# Patient Record
Sex: Male | Born: 1965 | Race: Black or African American | Hispanic: No
Health system: Southern US, Community
[De-identification: ages and names within clinical notes are randomized; demographics above are authoritative.]

---

## 2013-06-30 ENCOUNTER — Ambulatory Visit (INDEPENDENT_AMBULATORY_CARE_PROVIDER_SITE_OTHER): Payer: Self-pay

## 2013-06-30 ENCOUNTER — Other Ambulatory Visit: Payer: Self-pay | Admitting: Emergency Medicine

## 2013-06-30 DIAGNOSIS — T1490XA Injury, unspecified, initial encounter: Secondary | ICD-10-CM

## 2013-06-30 DIAGNOSIS — M25579 Pain in unspecified ankle and joints of unspecified foot: Secondary | ICD-10-CM

## 2013-06-30 DIAGNOSIS — M2040 Other hammer toe(s) (acquired), unspecified foot: Secondary | ICD-10-CM

## 2013-06-30 DIAGNOSIS — M25569 Pain in unspecified knee: Secondary | ICD-10-CM

## 2015-01-18 IMAGING — CR DG ANKLE COMPLETE 3+V*R*
3 series · 3 of 3 positions shown · non-contrast
Comparison: None.

CLINICAL DATA: Foot and ankle pain after trauma 6 days ago.

EXAM:
RIGHT ANKLE - COMPLETE 3+ VIEW; RIGHT FOOT COMPLETE - 3+ VIEW

[view not recorded (1 of 3)]
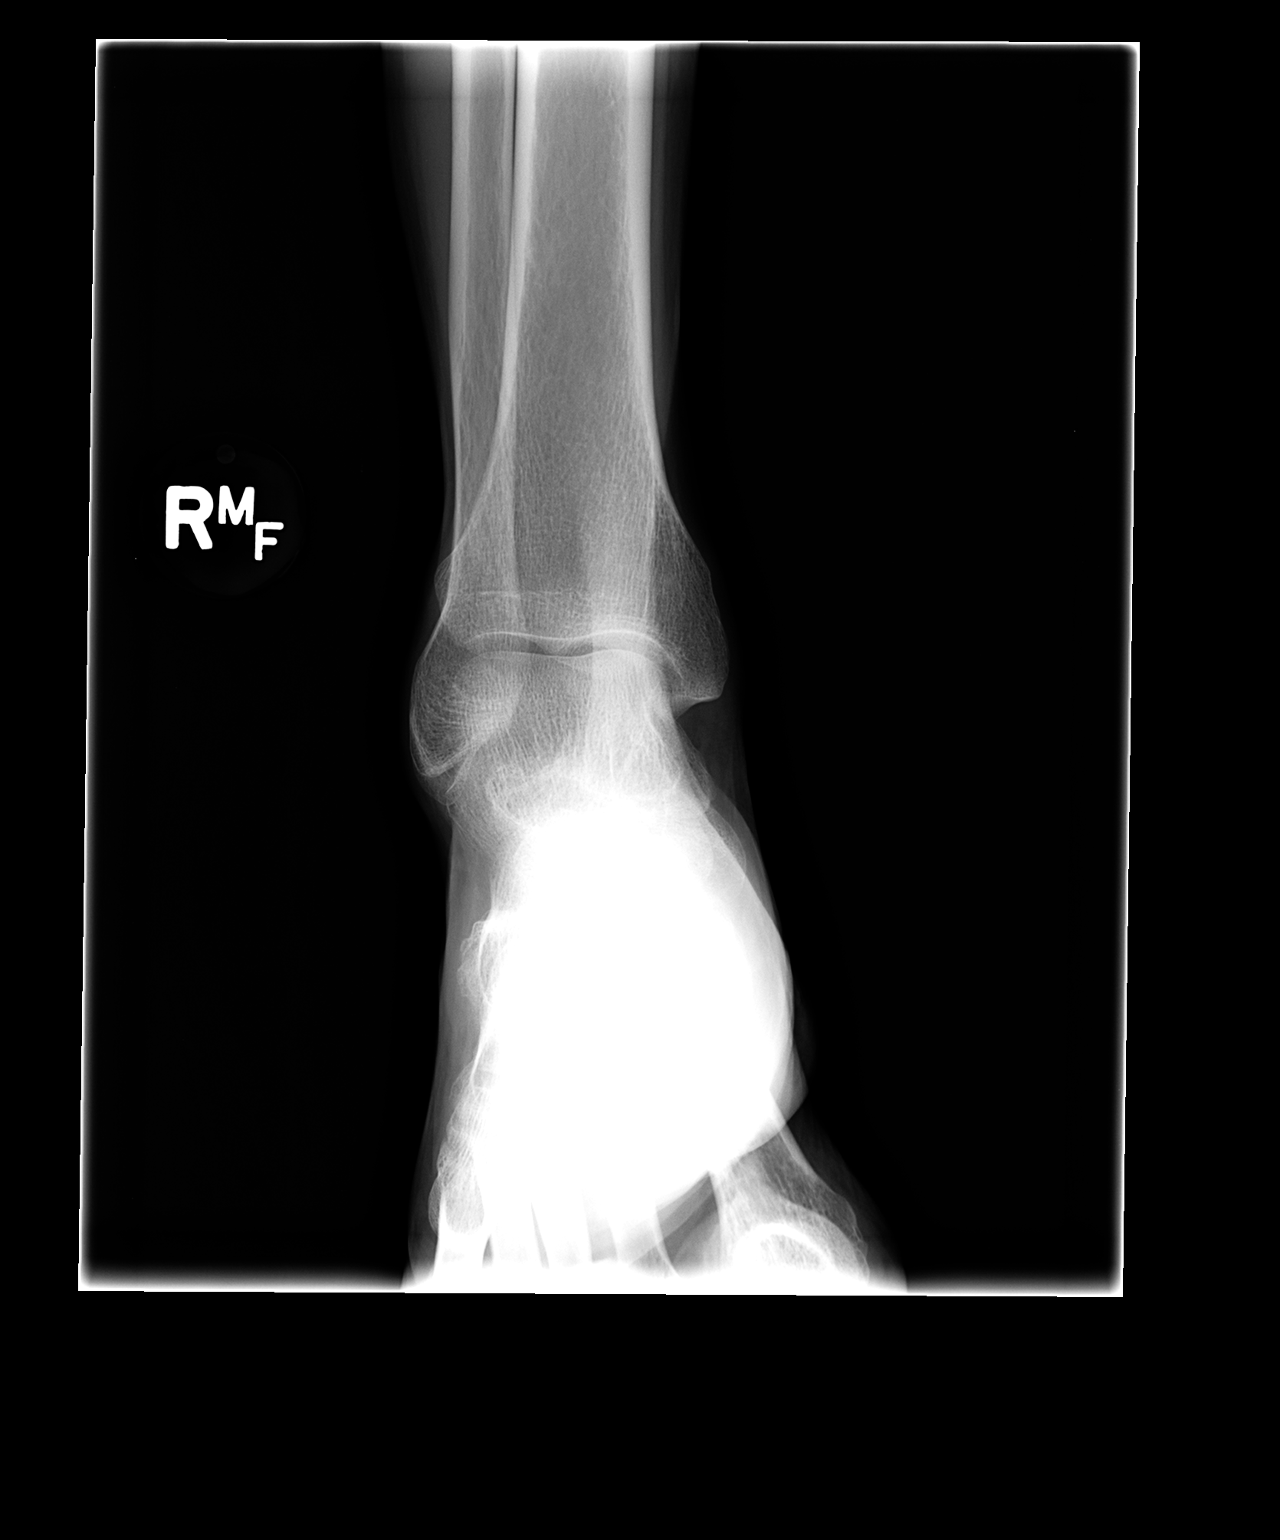

[view not recorded (2 of 3)]
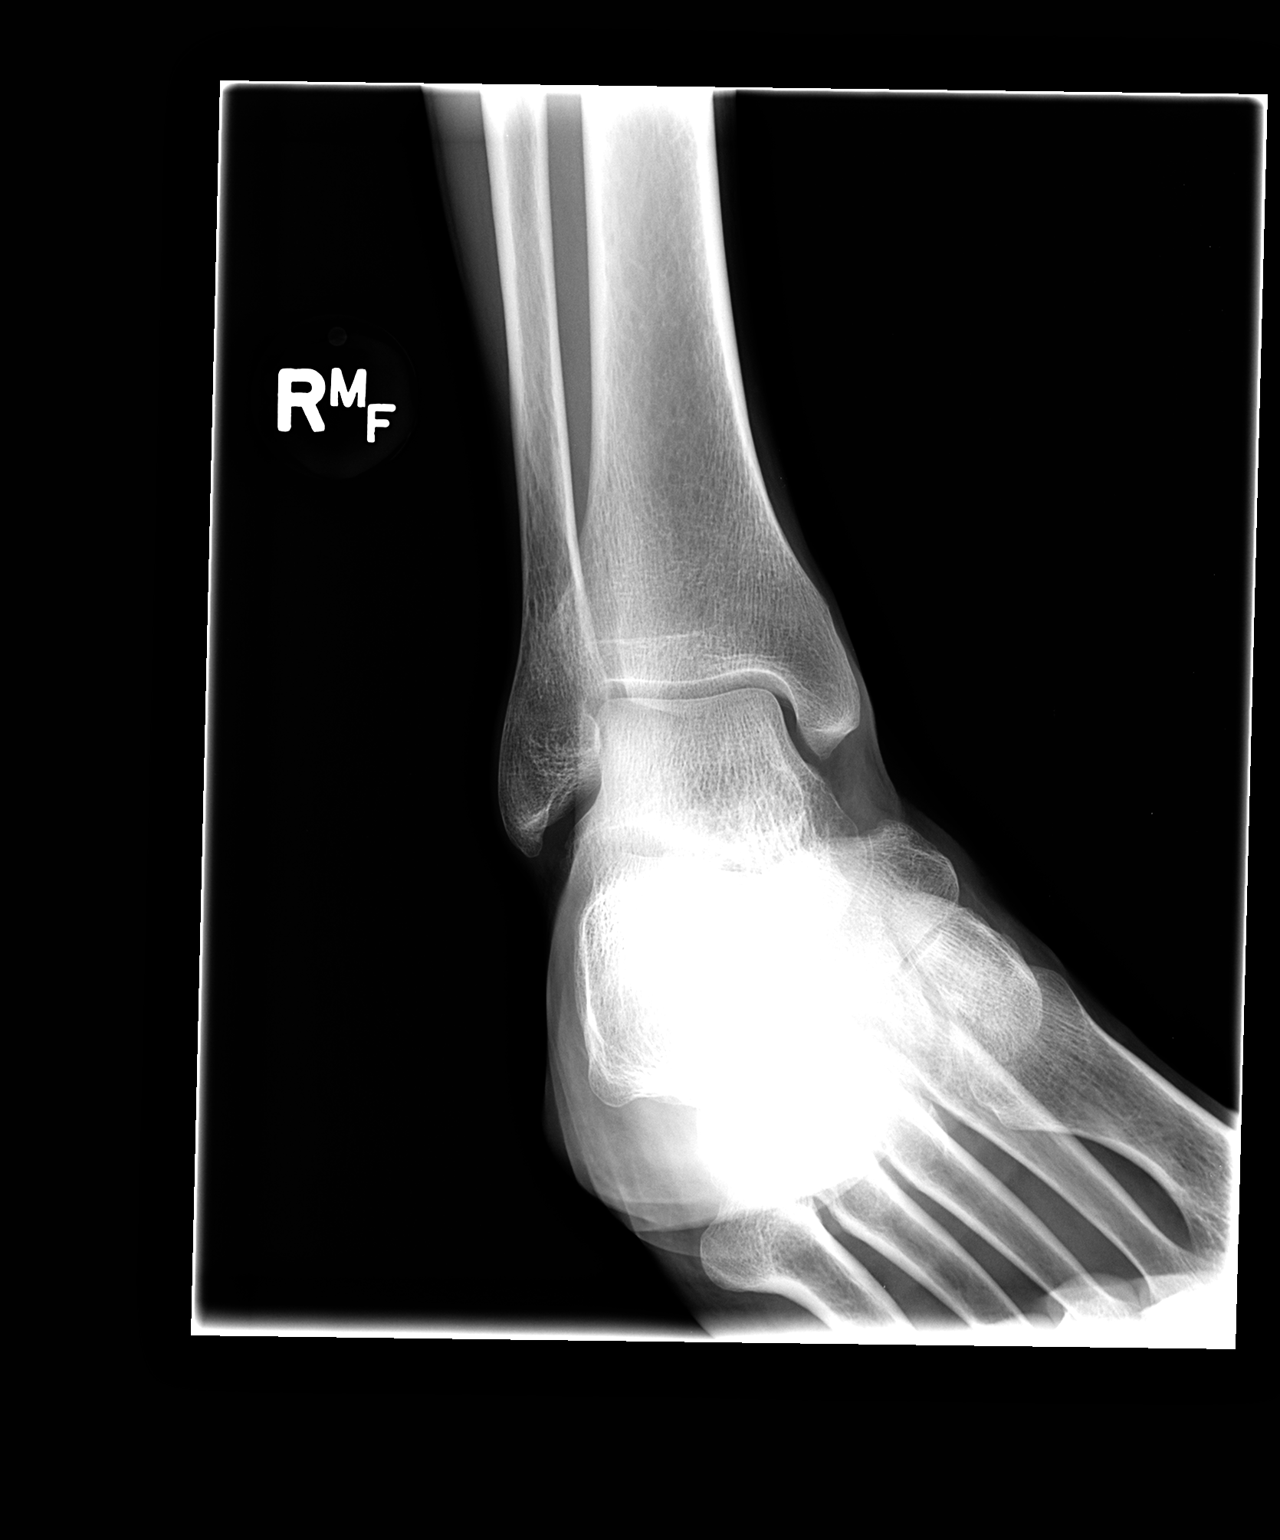

[view not recorded (3 of 3)]
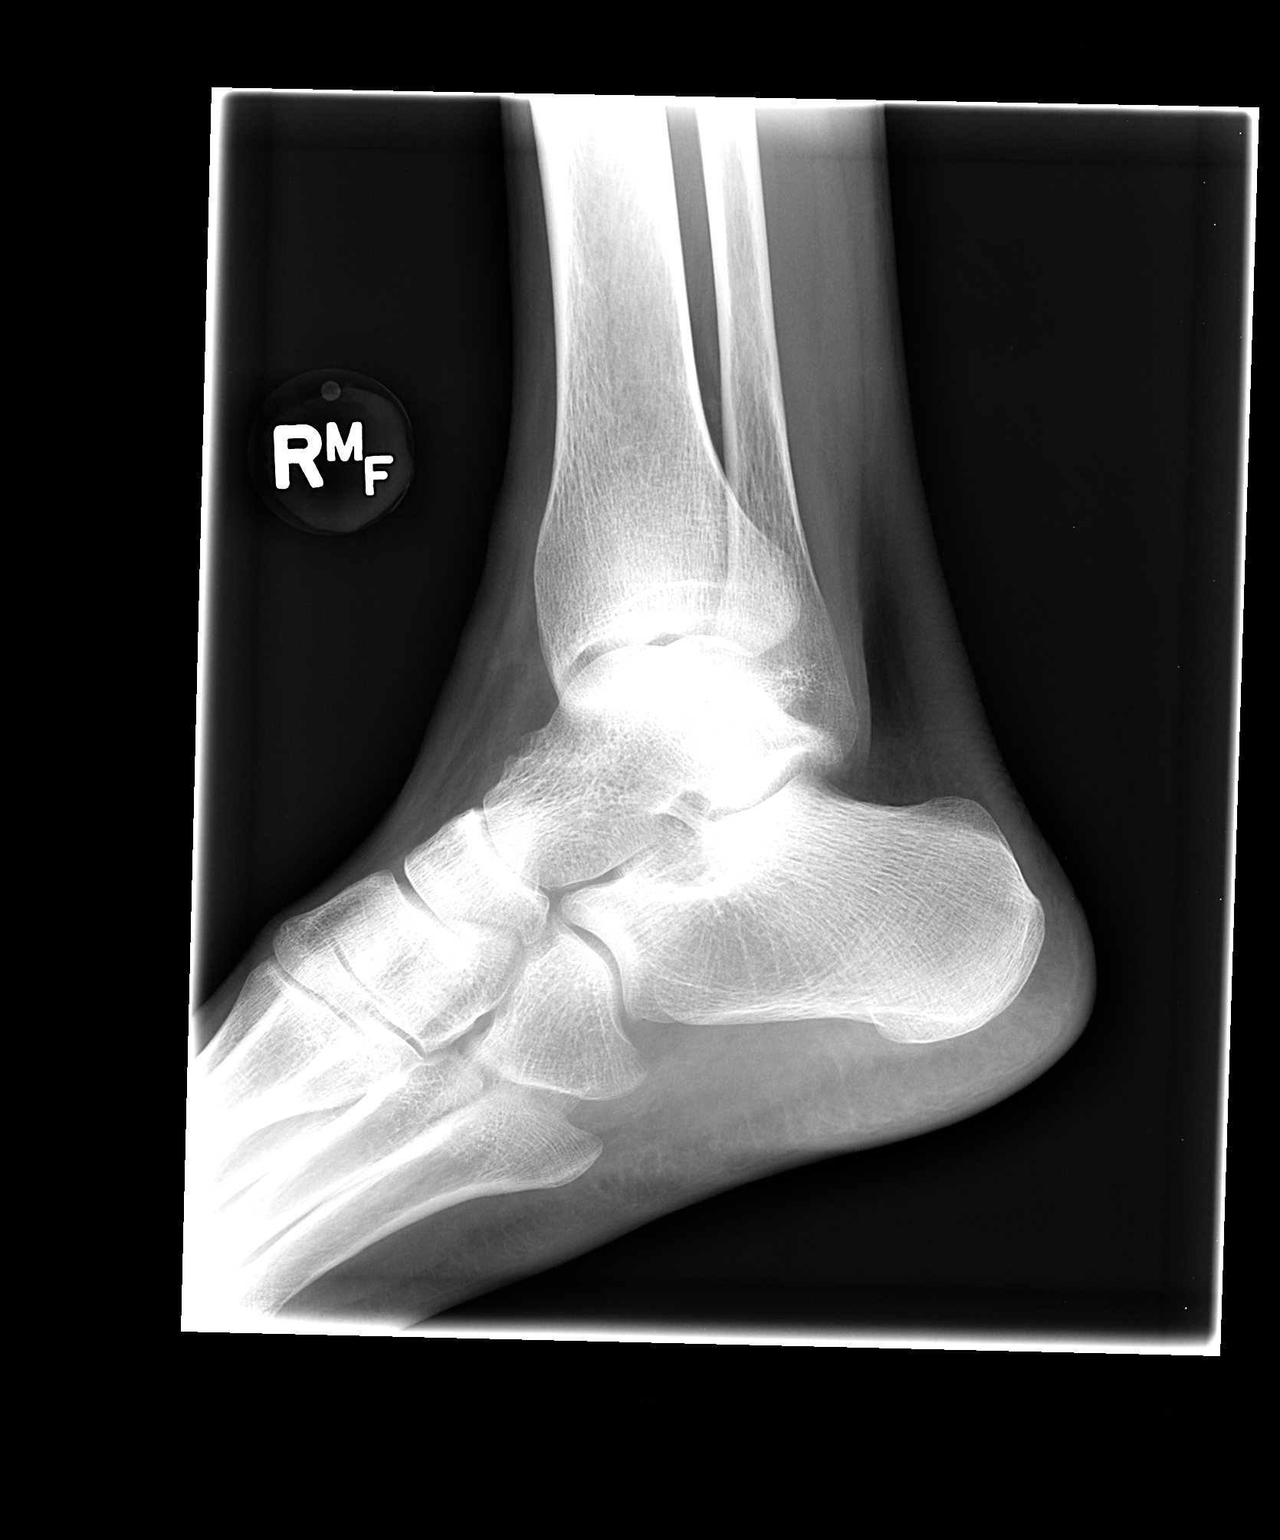

[3 of 3 positions shown; findings below may reference images not displayed]

FINDINGS: Three views of the right ankle demonstrate no fracture or
dislocation. No significant soft tissue swelling. Base of fifth
metatarsal and talar dome intact.

Three views of the right foot demonstrate no acute fracture or
dislocation. Hammertoe deformities.
IMPRESSION: No acute findings about the right foot/ankle.
